# Patient Record
Sex: Male | Born: 1990 | Race: White | Hispanic: No | Marital: Single | Smoking: Never smoker
Health system: Southern US, Community
[De-identification: ages and names within clinical notes are randomized; demographics above are authoritative.]

---

## 2020-09-03 ENCOUNTER — Other Ambulatory Visit: Payer: Self-pay

## 2020-09-03 ENCOUNTER — Encounter (HOSPITAL_COMMUNITY): Payer: Self-pay | Admitting: Emergency Medicine

## 2020-09-03 ENCOUNTER — Emergency Department (HOSPITAL_COMMUNITY)
Admission: EM | Admit: 2020-09-03 | Discharge: 2020-09-03 | Disposition: A | Discharge status: Home / Self Care | Attending: Emergency Medicine | Admitting: Emergency Medicine

## 2020-09-03 ENCOUNTER — Emergency Department (HOSPITAL_COMMUNITY)

## 2020-09-03 DIAGNOSIS — S93402A Sprain of unspecified ligament of left ankle, initial encounter: Secondary | ICD-10-CM | POA: Insufficient documentation

## 2020-09-03 DIAGNOSIS — X501XXA Overexertion from prolonged static or awkward postures, initial encounter: Secondary | ICD-10-CM | POA: Diagnosis not present

## 2020-09-03 DIAGNOSIS — S99912A Unspecified injury of left ankle, initial encounter: Secondary | ICD-10-CM | POA: Diagnosis present

## 2020-09-03 DIAGNOSIS — Y92149 Unspecified place in prison as the place of occurrence of the external cause: Secondary | ICD-10-CM | POA: Diagnosis not present

## 2020-09-03 DIAGNOSIS — Y990 Civilian activity done for income or pay: Secondary | ICD-10-CM | POA: Insufficient documentation

## 2020-09-03 MED ORDER — IBUPROFEN 800 MG PO TABS: 800.0000 mg | ORAL_TABLET | Freq: Three times a day (TID) | ORAL | 0 refills | Status: AC

## 2020-09-03 NOTE — ED Provider Notes (Signed)
Emergency Medicine Provider Triage Evaluation Note  Benjamin Farrell , a 30 y.o. male  was evaluated in triage.  Pt complains of left ankle pain after getting off scissor lift he heard his ankle pop.  States that he landed on it weird.  States that he thinks that the ankle inverted.  Pain primarily to lateral malleolus, no pain to midfoot.  No numbness or tingling.  Review of Systems  Positive: Left ankle pain Negative:   Physical Exam  BP 125/74 (BP Location: Right Arm)   Pulse 81   Temp 98.8 F (37.1 C) (Oral)   Resp 18   Ht 5\' 11"  (1.803 m)   Wt 88.5 kg   SpO2 95%   BMI 27.20 kg/m  Gen:   Awake, no distress   Resp:  Normal effort  MSK:   Moves extremities without difficulty, left ankle without any swelling or ecchymosis, tenderness to left lateral malleolus.  DP 2+.  No open fractures.   Medical Decision Making  Medically screening exam initiated at 4:08 PM.  Appropriate orders placed.  Benjamin Farrell was informed that the remainder of the evaluation will be completed by another provider, this initial triage assessment does not replace that evaluation, and the importance of remaining in the ED until their evaluation is complete.    Loleta Chance, PA-C 09/03/20 1609    09/05/20, MD 09/05/20 [phone]

## 2020-09-03 NOTE — Discharge Instructions (Addendum)
Your x-ray this evening did not show evidence of any broken bones or dislocations.  You likely have a sprain.  I recommend that you elevate and apply ice packs on and off to your ankle.  Use the crutches for weightbearing for at least 1 to 2 weeks.  Follow-up with the orthopedic provider listed in 1 week if your symptoms are not improving.

## 2020-09-03 NOTE — ED Triage Notes (Signed)
Pt from [facility] [employer].  Reports was getting off of a scissor lift and felt left ankle pop.  Reports fell when he felt it pop.

## 2020-09-03 NOTE — ED Provider Notes (Signed)
[facility] EMERGENCY DEPARTMENT Provider Note   CSN: [id number] Arrival date & time: 09/03/20  1549     History Chief Complaint  Patient presents with  . Ankle Pain    Benjamin Farrell is a 30 y.o. male.  HPI     Benjamin Farrell is a 30 y.o. 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  Who presents to the Emergency Department complaining of left ankle pain and swelling.  He states he stepped off a scissor lift and "rolled" his left ankle.  He heard and felt a pop and noticed immediate swelling of his ankle.  Unable to bear weight to his left foot after the incident occurred.  He denies other injuries.  No numbness or tingling of the extremity.   History reviewed. No pertinent past medical history.  There are no problems to display for this patient.   History reviewed. No pertinent surgical history.     No family history on file.  Social History   Tobacco Use  . Smoking status: Never Smoker  . Smokeless tobacco: Never Used  Substance Use Topics  . Alcohol use: Never    Home Medications Prior to Admission medications   Medication Sig Start Date End Date Taking? Authorizing Provider  ibuprofen (ADVIL) 800 MG tablet Take 1 tablet (800 mg total) by mouth 3 (three) times daily. 09/03/20  Yes Dois Juarbe, PA-C    Allergies    Patient has no known allergies.  Review of Systems   Review of Systems  Constitutional: Negative for chills and fever.  Musculoskeletal: Positive for arthralgias (Left ankle pain and swelling). Negative for back pain and neck pain.  Skin: Negative for color change and wound.  Neurological: Negative for dizziness, syncope, weakness and numbness.    Physical Exam Updated Vital Signs BP 125/74 (BP Location: Right Arm)   Pulse 81   Temp 98.8 F (37.1 C) (Oral)   Resp 18   Ht 5\' 11"  (1.803 m)   Wt 88.5 kg   SpO2 95%   BMI 27.20 kg/m   Physical Exam Vitals and nursing note reviewed.  Constitutional:       Appearance: Normal appearance. He is not ill-appearing.  HENT:     Head: Normocephalic.  Neck:     Thyroid: No thyromegaly.     Meningeal: Kernig's sign absent.  Cardiovascular:     Rate and Rhythm: Normal rate and regular rhythm.     Pulses: Normal pulses.  Pulmonary:     Effort: Pulmonary effort is normal.     Breath sounds: Normal breath sounds. No wheezing.  Musculoskeletal:        General: Swelling, tenderness and signs of injury present.     Cervical back: Normal range of motion and neck supple. No tenderness.     Comments: Tender to palpation of the lateral left ankle.  Moderate edema noted.  Extremity warm, no open wound, ecchymosis or erythema.  No bony deformity noted.  Skin:    General: Skin is warm.     Capillary Refill: Capillary refill takes less than 2 seconds.     Findings: No rash.  Neurological:     General: No focal deficit present.     Mental Status: He is alert and oriented to person, place, and time.     Sensory: No sensory deficit.     Motor: No weakness.     ED Results / Procedures / Treatments   Labs (all labs ordered  are listed, but only abnormal results are displayed) Labs Reviewed - No data to display  EKG None  Radiology DG Ankle Complete Left  Result Date: 09/03/2020 CLINICAL DATA:  Ankle injury, limited range of motion EXAM: LEFT ANKLE COMPLETE - 3+ VIEW COMPARISON:  None. FINDINGS: There is no evidence of fracture, dislocation, or joint effusion. There is no evidence of arthropathy or other focal bone abnormality. Soft tissues are unremarkable. IMPRESSION: Negative. Electronically Signed   By: Judie Petit.  Shick M.D.   On: 09/03/2020 16:54    Procedures Procedures   Medications Ordered in ED Medications - No data to display  ED Course  I have reviewed the triage vital signs and the nursing notes.  Pertinent labs & imaging results that were available during my care of the patient were reviewed by me and considered in my medical decision  making (see chart for details).    MDM Rules/Calculators/A&P                           Patient here for evaluation of left ankle pain and swelling after a mechanical injury.  Neurovascularly intact.  No open wounds.  X-ray negative for fracture or dislocation.  Likely sprain.  Discussed RICE therapy and close orthopedic follow-up if not improving.  Patient agreeable to plan.  Prescription NSAID written.  ASO brace applied, patient also given crutches   Final Clinical Impression(s) / ED Diagnoses Final diagnoses:  Sprain of left ankle, unspecified ligament, initial encounter    Rx / DC Orders ED Discharge Orders         Ordered    ibuprofen (ADVIL) 800 MG tablet  3 times daily        09/03/20 1834           Pauline Aus, PA-C 09/03/20 1843    Mancel Bale, MD 09/05/20 [phone]

## 2022-12-18 IMAGING — DX DG ANKLE COMPLETE 3+V*L*
3 series · 3 of 3 positions shown · non-contrast
Comparison: None.

CLINICAL DATA: Ankle injury, limited range of motion

EXAM:
LEFT ANKLE COMPLETE - 3+ VIEW

[ankle ap]
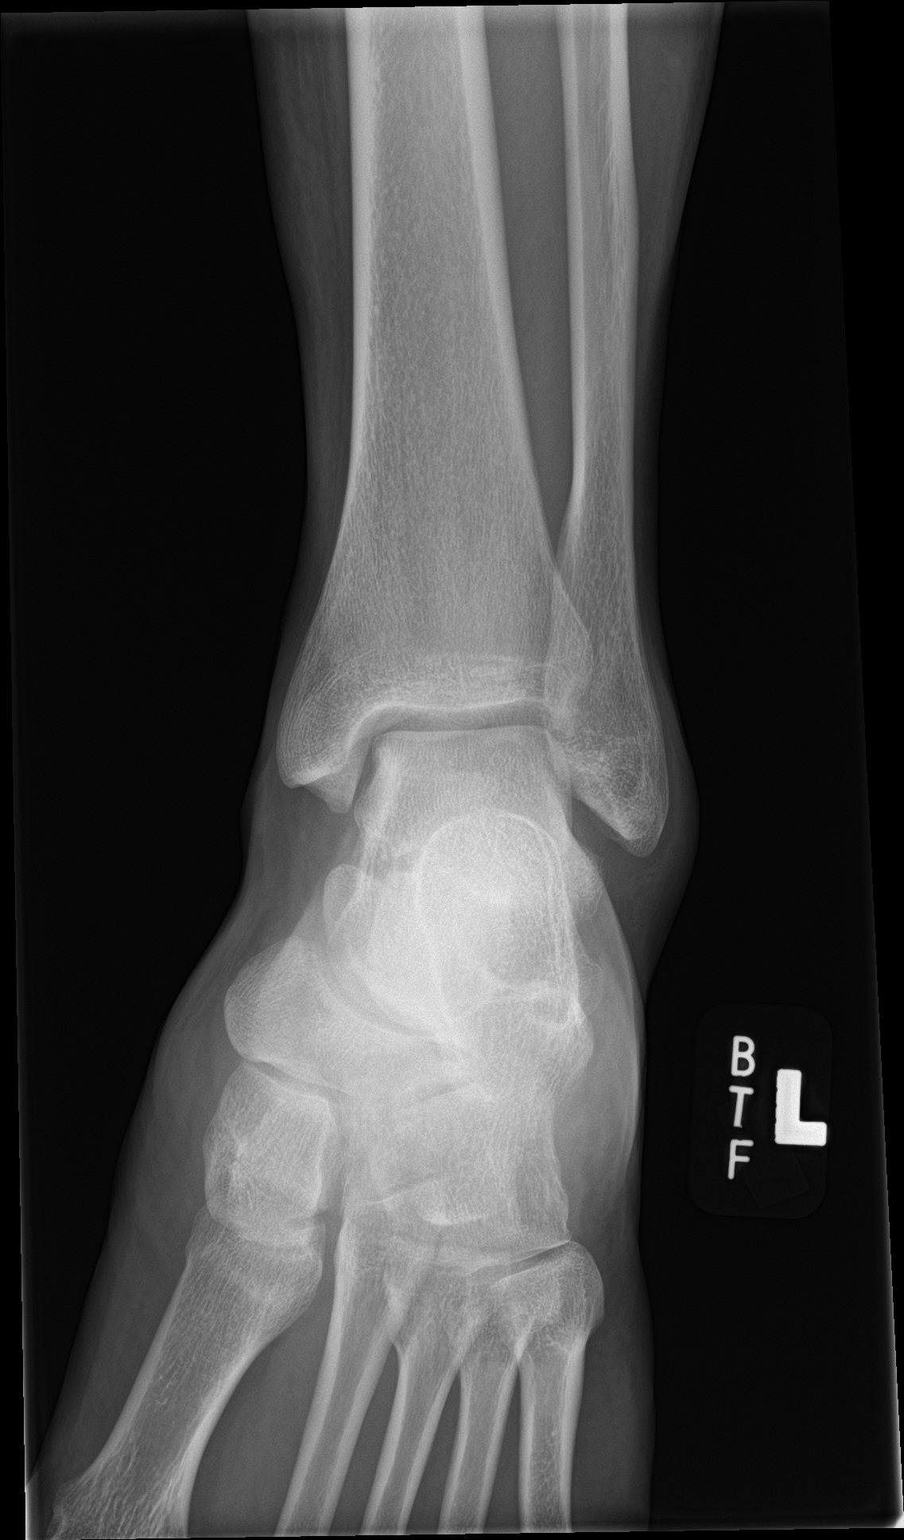

[ankle obl]
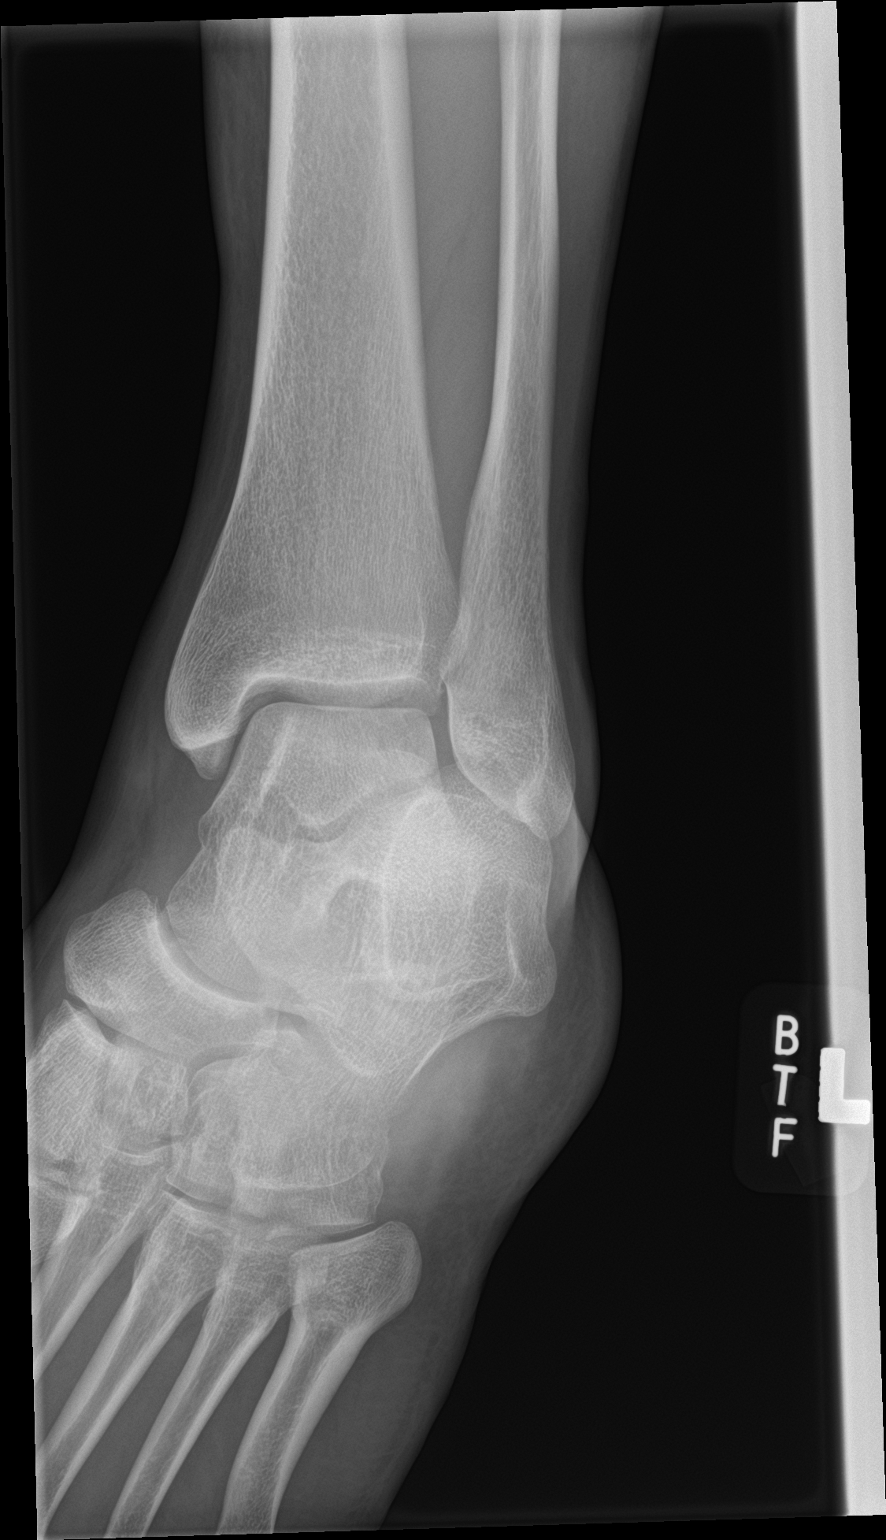

[ankle lat]
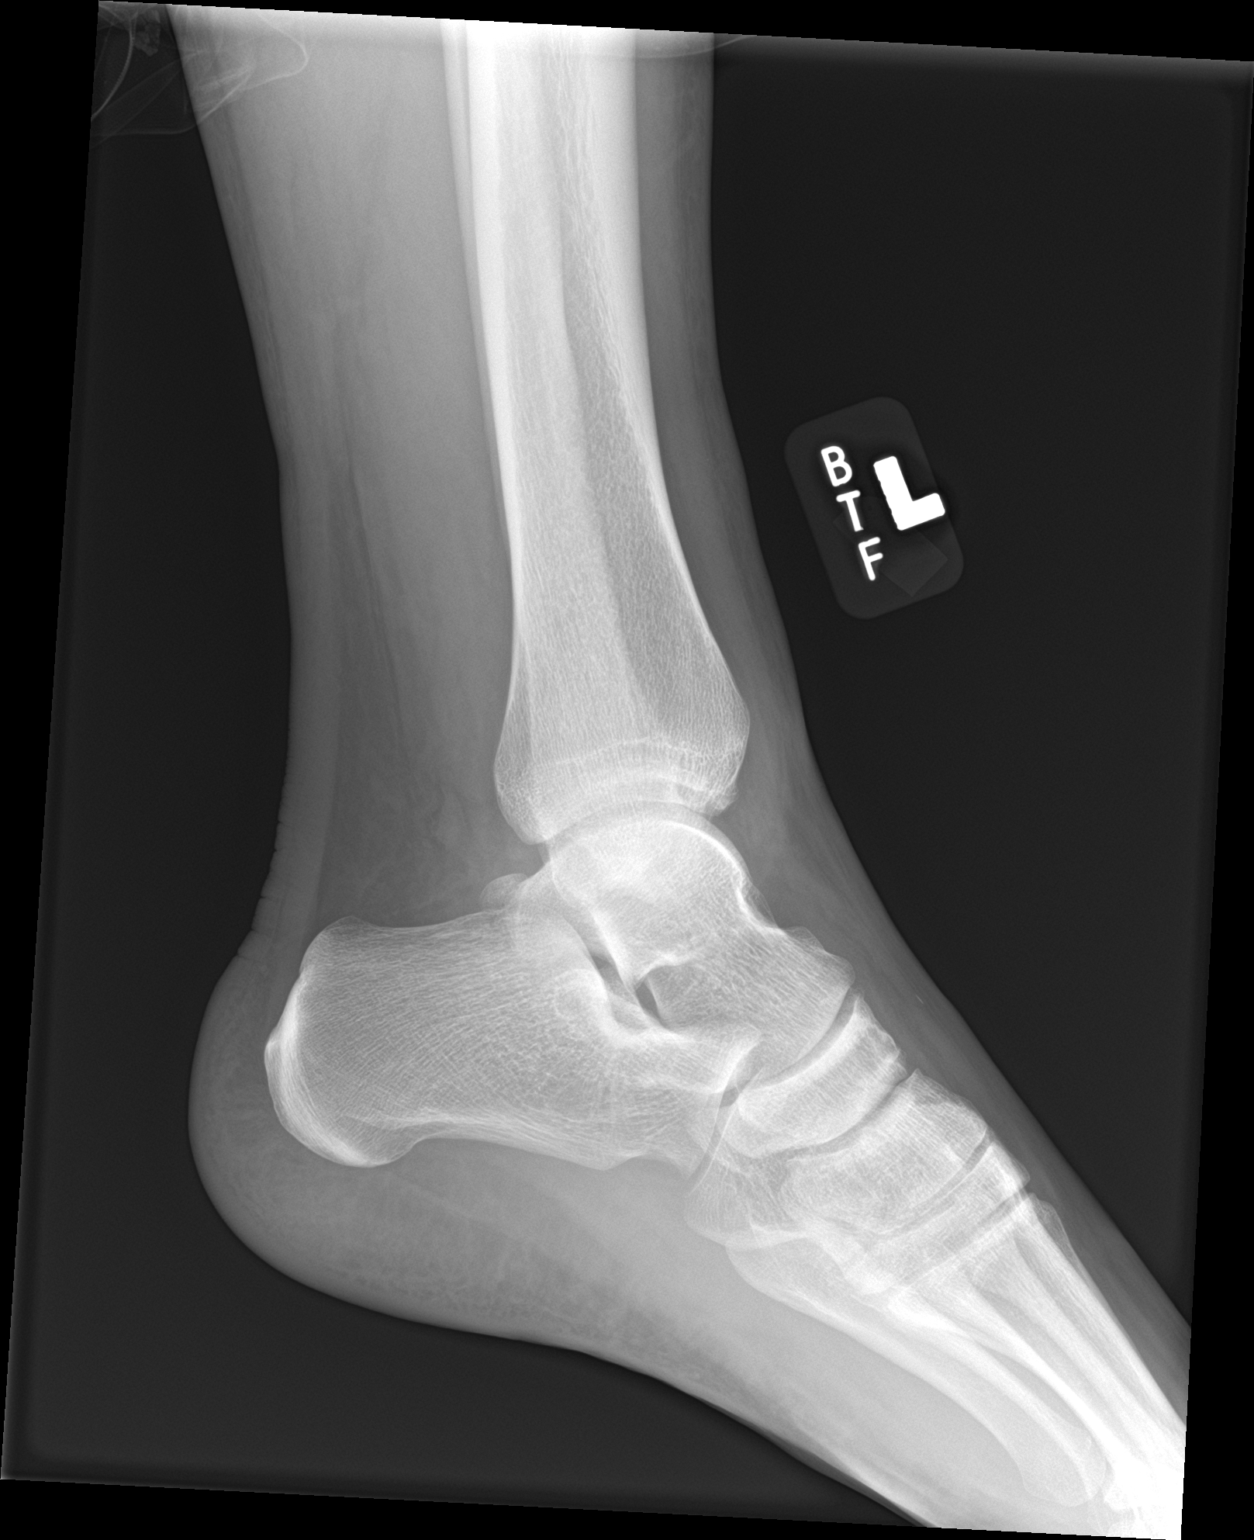

[3 of 3 positions shown; findings below may reference images not displayed]

FINDINGS: There is no evidence of fracture, dislocation, or joint effusion.
There is no evidence of arthropathy or other focal bone abnormality.
Soft tissues are unremarkable.
IMPRESSION: Negative.
